# Patient Record
Sex: Female | Born: 1955 | Race: White | Hispanic: No | Marital: Married
Health system: Midwestern US, Community
[De-identification: ages and names within clinical notes are randomized; demographics above are authoritative.]

---

## 2017-09-13 ENCOUNTER — Ambulatory Visit (HOSPITAL_COMMUNITY): Payer: Self-pay | Admitting: Audiologist

## 2019-07-06 NOTE — Telephone Encounter (Signed)
patient calling  She had imaging done at [facility] and wants to know if MD needs those. Patient has appt on 07/17/19    Call patient at [phone]

## 2019-07-10 NOTE — Telephone Encounter (Signed)
Called patient to discuss imaging done at [facility] clinic asked patient to return my call.

## 2019-07-10 NOTE — Telephone Encounter (Signed)
Patient returned call. Please advise.

## 2019-07-11 NOTE — Telephone Encounter (Signed)
Phone call was transferred to me, pt stated she is moving to [place]. She will be moving in with her sister while she finds a place to live. Pt stated she will call back with her sister's address to update her chart. I verbalized that she will need to have a home address within state lines to complete the visit. Pt verbalized understanding and had no further questions.

## 2019-07-11 NOTE — Unmapped (Signed)
Called Chestine Spore no answer, left VM.     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 Pt is scheduled for a NPV video visit on 07/17/19, with patient not having a home address within the states of licensure pt appt will need to be cancelled. If pt would like to see MD she will need to come for an in person appt, the next in person appointment with Dr. Jimmey Ralph currently is 11/01/19 @9 :00. Please inform pt when she calls back and reschedule her appropriately, thanks!

## 2019-07-11 NOTE — Telephone Encounter (Signed)
Pt imaging reports are in the media tab. No further action needed.

## 2019-07-11 NOTE — Telephone Encounter (Signed)
Pt returned call to [place]. She is hoping to keep the VIDEO appt, they are moving here and should have their address before the appt.

## 2019-07-16 NOTE — Unmapped (Signed)
Called Martha Davila verbalized that pt has not yet completed her questionnaries for upcoming NPV video visit on 07/17/19. I verbalized that these are a requirement for the clinic and must be completed prior to the appointment. Pt verbalized understanding and had no further questions.

## 2019-07-17 ENCOUNTER — Ambulatory Visit: Admit: 2019-07-17 | Discharge: 2019-07-17 | Payer: PRIVATE HEALTH INSURANCE | Attending: Neurology

## 2019-07-17 DIAGNOSIS — G4452 New daily persistent headache (NDPH): Secondary | ICD-10-CM

## 2019-07-17 MED ORDER — diclofenac (VOLTAREN) 50 MG EC tablet
50 | ORAL_TABLET | ORAL | 3 refills | Status: AC
Start: 2019-07-17 — End: ?

## 2019-07-17 NOTE — Unmapped (Signed)
[facility]   [facility]  Initial Visit    Patient name: Martha Davila  MRN:  [id number]  Date: 07/17/2019    History of Present Illness     CC: Headache     This is a 64 y.o. year old female who presents to the headache clinic as a new patient for evaluation of headaches.    Age of onset: Headaches first started 4 years ago. Started ear pain on the left and then significant headache and neck pain and tinnitus. ENT had just given her antibiotics and steroid. She was also told some fluid behind that ear. She decided to go to ER - [facility]. She had CT brain unremarkable and then an LP with some difficulties. She states she had  MRI that was unremarkable was sent to ENT.  She was told she could do a blood patch but she wasn't interested at that time.      She said her arms were going numb and she had a severe headache that developed. She had severe diarrhea as well.  She saw her ENT and was told unremarkable exam and that it would go away on its own.  She said she was told left ear has negative pressure. She said she felt like she couldn't even hold her head up. She had difficulty eating.She states she has gone to multiple institutions for neurology and neurosurgical evaluation. She states [facility] saw her and repeated MRI brain and did EMG/NCS for her paresthesias and nothing was found but she was diagnosed with depression. She was placed on remeron, klonopin, and olanzapine. She has been weaned off of this because she felt only psychiatric diagnosis were being addressed but not cause of her headaches.  She has also been to [facility] and had an unrevealing workup.  She states she has scheduled appts at [facility] and a second opinion at [facility] for later this month.  Two weeks ago she saw [facility] Dr Karrie Doffing and was diagnosed with positional headache and started on Venlafaxine which she hasn't taken yet. Reviewed Dr Gladys Damme note in detail and as  well as outside imaging.    Aura:No  Location: Bilateral occipital and Bilateral temples  Quality:  Squeezing type pain  Duration: constant daily head pain  Intensity: Headache ranges from 4 to 9/10     Frequency: 30 Headache days/month     15 Severe, and the rest are mild - moderate.       Associated factors:    Yes Photophobia and phonophobia    No Nausea and/or vomiting   Yes Blurred vision or double vision  - since LP she has a constant flicker in right eye and floaters in both eyes    Yes Worsened with activity   Yes Dizziness/light headedness but no spinning   Yes Focal numbness/tingling - left face and arm numbness/tingling, started four years ago.   No Focal weakness   No Autonomic features   Yes Neck/Shoulder pain or stiffness - worse on left. She tried PT and medical massage for a year   No Restless/Pacing the floors improves it    Yes Worsens with cough   Yes Positional  - improvement with laying down   Yes Tinnitus    No Headache awakens from sleep    Yes Time of day pattern - worse in evening    Alleviating factors: darkness;lying down  Aggravating Factors: bending over;activity;coughing    Head Trauma:  No    Medications:  Current Preventives: None  Current Abortives:  Aleve or Advil (heps a small amount)  Number of days acute medications used: 0 days/week  Failed Headache Meds: Rizatriptan (didn't help)  *Allergic to sulfa      Review of Systems     ROS    Answers for HPI/ROS submitted by the patient on 07/16/2019  Activity Change: Yes  Appetite change: No  Chills: No  Diaphoresis (Excessive Sweating): No  Fatigue: Yes  Fever: No  Unexpected Weight Change: No  Facial Swelling: No  Neck Pain: Yes  Neck Stiffness: Yes  Ear Discharge: No  Hearing Loss: Yes  Ear Pain: Yes  Tinnitus (ringing in ears): Yes  Nosebleeds: No  Congestion: No  Rhinorrhea (Excessive discharge from the nose): Yes  Postnasal Drip: Yes  Sneezing: No  Sinus Pressure: Yes  Dental Problems: No  Drooling: No  Mouth Sores: No  Sore Throat:  No  Trouble Swallowing: No  Voice Change: Yes  Eye Discharge: No  Eye Itching: No  Eye Pain: Yes  Eye Redness: Yes  Photophobia (Sensitivity to Light): Yes  Visual Disturbance: Yes  Apnea: No  Chest Tightness: No  Choking: No  Cough: No  Shortness of Breath: Yes  Stridor (High pitched wheezing): No  Wheezing: No  Chest Pain: No  Leg Swelling: No  Palpitations (Abnormal heartbeat): Yes  Abominal distention (Abdominal Swelling): No  Abdominal Pain: No  Anal Bleeding: No  Blood in Stool: No  Constipation: No  Diarrhea: No  Nausea: No  Rectal Pain: No  Vomiting: No  Cold Intolerance: Yes  Heat Intolerance: No  Polydipsia (excessive thirst): Yes  Polyphagia (increased appetite): No  Polyuria (frequent urination): No  Difficulty urinating: No  Dysuria (Pain with Urination): No  Enuresis (Urinary incontinence): No  Flank pain (Pain on one side between upper abdomen and back): No  Frequency (urine): No  Genital sore: No  Hematuria (blood in urine): No  Urgency (urine): No  Urine decreased: No  Arthralgias (Joint pain): No  Back pain: Yes  Gait problem (Problems walking): No  Joint swelling: No  Myalgias (Muscle Pain): No  Color change: No  Pallor (Pale skin): No  Rash : No  Wound: No  Environmental allergies: Yes  Food Allergies : No  Immunocompromised: No  Dizziness: Yes  Facial asymmetry: No  Headaches : Yes  Light-headedness: Yes  Numbness: Yes  Seizures: No  Speech difficulty: No  Syncope (fainting): No  Tremors: No  Weakness: No  Adenopathy (swollen lymph): No  Bruises/bleeds easily: Yes  Agitation: No  Behavior problem: No  Confusion: No  Decreased concentration: Yes  Dysphoric mood (unpleasant mood): No  Hallucinations: No  Hyperactive: No  Nervous/anxious: Yes  Self-injury: No  Sleep disturbance: Yes  Suicidal ideas: No       Past History/Allergies     Past Medical History  Past Medical History:   Diagnosis Date   ??? Anxiety and depression        Past Surgical History  Past Surgical History:   Procedure Laterality  Date   ??? SINUS SURGERY     ??? TONSILLECTOMY         Past Family History  History reviewed. No pertinent family history.    Social History    No Drug use  No Alcohol use  Non smoker    Allergies  Allergies   Allergen Reactions   ??? Tetracycline Shortness Of Breath and Rash   ??? Morphine Nausea  Only   ??? Sulfa (Sulfonamide Antibiotics) Rash       Home Medications     Current Outpatient Medications   Medication Sig   ??? estrogen (conjugated)-medroxyprogesterone Take 1 tablet by mouth daily.   ??? eszopiclone Take 1 mg by mouth at bedtime. Take immediately before bedtime.     No current facility-administered medications for this visit.       Physical Examination     Vital Signs: There were no vitals taken for this visit.   **EXAMINATION LIMITED BY TELEVIDEO EXPERIENCE    General Appearance: Well nourished female in no acute distress  HEENT: Atraumatic, normal sclera, moist mucous membranes.  +GON tenderness bilaterally  Pulmonary: no increase WOB  Psych: Normal mood and affect  Musculoskeletal: normal ROM, tenderness in neck and shoulders    Neurology:  Mental Status:   Alert oriented to person, place, time and situation.  Able to follow simple and complex commands.  Language appropriate.    Cranial Nerves  II: Visual fields grossly intact to finger confrontation.    III/IV/VI: Extraocular movements intact without nystagmus      V: Facial sensation normal to light touch bilaterally  VII: No gross facial asymmetry   VIII: Hearing intact to voice  IX/X: Palate elevation symmetric  XI: sternocleiomastoid grossly normal with head turns and shoulder shrug  XII: Tongue protrudes midline    Motor:  All limbs antigravity - no gross focal abnormalities observed.  No pronator drift. Able to walk on heels and toes.    Sensory: Intact to light touch in all extremities     Coord: Finger to nose intact bilaterally.  Heel-Knee-Shin intact    Gait: Normal gait. Able to walk tandem without difficulty. Romberg negative    Diagnostic Imaging      Multiple MRI's reviewed and uploaded under Media tab, most recent 2 below:    MRI Brain w/wo contrast 05/09/2019  Impression: Normal MRI brain    MRI brain w/wo contrast 08/2015  Stable tiny left cerebellar lacunar infarct. No significant change since 04/2015.     Assessment     Encounter Diagnoses   Name Primary?   ??? New daily persistent headache Yes   ??? Intractable chronic migraine without aura and without status migrainosus    ??? Positional headache    ??? Bilateral occipital neuralgia    ??? Chronic neck pain    ??? Tinnitus of both ears        Patient with a four year history of new onset headaches that started suddenly with neck stiffness/pain and dizziness and significantly worsened in severity following lumbar puncture who currently meets International Classification of Headache Disorders-3 criteria for new daily persistent headache with chronic migraine features and possible occipital neuralgia.  She does have a mild positional headache where symptoms improve some with laying down but discussed this can be seen with migraine headache as well. Potential CSF leak 2/2 LP was investigated with MRI brain which does not show any of the features of intracranial hypotension.    Reviewed previous imaging from several institutions, as well as previous neurology notes from [place].  Examination non-focal and last MRI completed 3 months ago so no need for further imaging at this time.  If headaches change or new symptoms arise will re-evaluate the need at next visit.      Despite having headache present daily for the last four years she has not done a trial of a headache preventive medication.  Recommend patient try a  migraine headache preventive prior to non-targeted blood patch since no strong objective evidence pointing towards CSF leak currently. Patient recently evaluated by [facility]surgery two weeks ago. Agree with their plan to start Effexor for headache prevention. If fails Effexor could consider TCA vs.  BB/CCB vs. Botox for chronic migraine.  Patient states she has already contacted [facility] and requested a repeat opinion at [facility] to discuss intracranial hypotension treatment options further later this month.  Will get the rest of her records for review from [facility] and [facility].    Discussed potential of GONB to break current daily headache cycle since she has Bilateral ON tenderness and chronic daily headache.  She could also benefit from trying a round of steroids vs. IV outpatient infusions.    Hx of lacunar infarct - MRI 2017. Recommend patient start asprin 81mg  daily. Secondary stroke risk factor counseling was completed. Recommend she have her HgA1c and lipid panel checked with PCP and start statin if LDL above goal below.    Chronic neck pain - consider MRI C Spine if not completed yet. Recommend PT or medical massage therapy for myofascial pain.    Plan     1. Preventive medication:   --Agree with starting Effexor 37.5mg  daily x 82month then 75mg  daily  --Start OTC supplements: Magnesium 400mg , Riboflavin (vitamin B2) 400mg , and coenzyme Q10 100/200mg  daily     2. Abortive medication:  --Triptan: No DHE/Triptan due to stroke history  --Start Diclofenac 50mg  every 6 hours as needed for medium to severe headache. Do NOT take more than 3 pills in 24hrs. Do not use with other NSAIDs and do not take more then 3 days/week to prevent medication overuse headache  --Discussed medication overuse headache in detail and to limit use of analgesics to no more than 2-3 days per week to prevent this.    3.  Reviewed previous imaging in detail with patient.    --Tests or labs ordered: None    4. Headache education was done:  --Discussed headache type listed above in detail with patient and typical pattern of how specific headache type behaves over course of life.  --Instructed patient about preventive/abortive medication options and discussed potential adverse effects and drug interactions of prescribed medications  above.  --Discussed importance of keeping a headache diary to track frequency, severity, abortive med use and triggers.   --Discussed triggers and lifestyle modifications including limiting caffeine consumption, limiting alcohol intake, increasing water intake, exercise 3-4x/week for at least 30-40 minutes and the importance of sleep hygiene.    --Discussed the relationship of mood and headache and the importance of treatment/therapy for mood disorders.    5. Stroke Risk factor management:   --Start aspirin 81mg  daily  --Goal BP < 120/80  --Goal LDL <70, Goal HDL>50   --Goal HgbA1c< 7  --Goal BMI < 30 kg/m2   --Discussed the importance of diet and exercise (at least 30 minutes of exercise 3-4 days/week)       6. Discussed that if patient has any new or worsening neurologic changes to seek urgent medical evaluation at the nearest ER.  Patient verbalized understanding of the above plan and encouraged to call the office if further questions or concerns.      Follow up: 3-4 months    CC:   --PCP: None    Thank you for referral and allowing me to participate in care of this patient. Please do not hesitate to contact me with questions/concerns.    This was a Video  visit, including two-way audio and video communication, in lieu of an in-person visit. The patient provided verbal consent to participate in the telehealth visit.   I spent 62 minutes speaking with the patient, conducting an interview, performing a limited telemedicine exam, and educating the patient on my assessment and plan. I also spent 40 minutes, on the same day as the encounter, preparing to see the patient (eg, review of tests), obtaining and/or reviewing separately obtained history, ordering medications, tests, or procedures, documenting clinical information in the electronic or other health record, providing care coordination  and performing non-face-to-face activities.      Antonietta Breach, MD  Assistant Professor of Neurology  [facility]  [facility]

## 2019-07-17 NOTE — Patient Instructions (Addendum)
Start Effexor for daily headache prevention    Recommend these daily headache supplements:  --Vitamin B2 400 mg daily.  Can cause your urine to turn orange.  --Magnesium 400-600 mg daily. Can cause diarrhea in some people, if this occurs okay to reduce dose or stop altogether.  --Coenzyme Q10 200mg  in the AM and 100mg  in the PM for a total of 300mg /day    Start Diclofenac 50mg  every 6 hours as needed for medium to severe headache. Do NOT take more than 3 pills in 24hrs. Do not use with other NSAIDs and do not take more then 3 days/week to prevent medication overuse headache    Start asprin 81mg  daily due to history of stroke    Please keep a headache diary notating headache days, severity of headache, meds used and possible triggers  --You can download the Migraine Buddy App on your phone to help you track or you can use a regular calendar, please ask staff for a migraine calendar if needed.    Counseling on migraine headache was provided:  - Appropriate use of abortive treatment: Do not use abortive (triptan or NSAID) more than two days per week to reduce the risk of rebound/medication overuse headache.  - Maintain a regular sleep pattern, at last 8 hours of uninterrupted sleep every night  - Avoid caffeine if possible. If you are drinking an increased amount of caffeine recommend you slowly wean down to 1 caffeinated beverage/day  - Avoid excessive alcohol  - Maintain good hydration, drink at least 8 cups of 8 Oz water / non caffeine drinks a day  - Exercise at least 3 days a week, for at least 30 minutes  - Women of childbearing years: Recommend the use of some form of contraception while being on the prescribed headache medications because of the potential of toxicity to the fetus. If you become pregnant please call the office immediately to discontinue the medication.    Important:   Medication Refills: Please call in medication refill requests in a timely manner, at least 3-4 days PRIOR to you running out of  your medication, to insure you receive your refills in time as they can take 24-48 hours to process.    MyChart Appropriate Usage:  MyChart messaging is for brief non-urgent questions or comments. These messages will be answered within 2-3 business days. If you have an urgent matter, have several questions or concerns please call our office directly to speak with staff so your issue can be properly triaged and handled in a timely manner.

## 2019-08-21 NOTE — Telephone Encounter (Signed)
ERROR

## 2019-10-09 NOTE — Telephone Encounter (Signed)
Pt is trying to send in paperwork over Mychart for Dr Jimmey Ralph to look at. Requesting call back. Please Advise.

## 2019-10-09 NOTE — Telephone Encounter (Signed)
Called and spoke to patient who stated that she send over some attachments for Dr. Jimmey Ralph to look at and she was worried that maybe she has an infection. I informed her that I received her mychart message and have forwarded over to Dr. Jimmey Ralph for review. Informed patient it would probably be tomorrow before we can get back to her.

## 2019-10-17 NOTE — Telephone Encounter (Signed)
Patient called to see if her appointment could be video instead of in person. Requesting a call back. Please advise.

## 2019-10-17 NOTE — Telephone Encounter (Signed)
Called and spoke to patient and she stated that she had another video visit to get on and to just reschedule her because she is not feeling good this week due to a UTI. Patient stated she would check My Chart for the new date. Scheduled her for in person (per the note showing) for Sept. 30th with the NP

## 2019-10-18 ENCOUNTER — Ambulatory Visit: Payer: PRIVATE HEALTH INSURANCE | Attending: Neurology

## 2019-11-08 ENCOUNTER — Ambulatory Visit: Payer: PRIVATE HEALTH INSURANCE | Attending: Family

## 2019-12-18 NOTE — Unmapped (Signed)
I cannot care for patient or have an video visit if she lives in [place] and she should not travel if she is concerned about meningitis - she should go to the ER for urgent evaluation and possible treatment. If she was unhappy with her previous ER a few weeks ago she should go to another ER close to her, perhaps one attached to university[facility]adem[facility] if possible.

## 2019-12-18 NOTE — Unmapped (Signed)
Called and lvm for the patient to call back so we can give Dr.Parkers information below.

## 2019-12-18 NOTE — Unmapped (Signed)
Pt is calling to inform that she is concerned that she may have meningitis. Pt c/o her neck being really stiff, getting crackling in her head and spine is hurting as well. Pt states this started a few weeks ago, went to the ER and a CTA of her head was done. Pt states she was told she is fine. Pt states she is always cold and temp has been 95.5. pt also states she kept getting infections since Spring and had blood patches. Pt mentioned she would come to [place] if she needs an LP. Pt lives in [place]. Pls call the pt.

## 2019-12-18 NOTE — Unmapped (Signed)
Pt called back and was given the message from Dr Jimmey Ralph and says thank you.

## 2020-04-02 NOTE — Telephone Encounter (Signed)
MA received past documentation on patient. Doctor to review it.    Electronically signed by Drue Stager on 04/02/2020 at 2:42 PM

## 2020-04-21 NOTE — Progress Notes (Signed)
MA called patient to reschedule appointment on 04/25/20. Left message.   Electronically signed by Drue Stager on 04/21/2020 at 1:19 PM

## 2020-04-25 ENCOUNTER — Encounter: Attending: Surgery

## 2020-05-23 ENCOUNTER — Encounter: Attending: Surgery

## 2020-09-12 ENCOUNTER — Telehealth (INDEPENDENT_AMBULATORY_CARE_PROVIDER_SITE_OTHER): Payer: Self-pay | Admitting: Orthopaedic Surgery

## 2020-09-12 NOTE — Nursing Note (Signed)
[facility] Referral  Received: Today  [facility]; Jolayne Panther Laurin Coder, LPN  I scanned records that was sent to me today and sent an e-mail to [facility]     Thanks,   [place] - records received. Messaged Dr Mendel Ryder info. Jen 8/5 @ 1557    2-3 weeks per Dr Mendel Ryder. Message sent for scheduling. Delsa Sale 8/5 @ [place], RN  09/12/2020, 16:01

## 2020-10-01 NOTE — Telephone Encounter (Signed)
Name of Caller: Alexina Kerestes phone number: [phone]    Relationship to Patient: patient    Provider: Para March    Practice:  Ortho    Chief Complaint/Reason for Call: Patient called stating that her PCP should be sending a referral for her to see Dr Para March. PCP Dr Alain [facility]. Patient was given Ortho fax number and advised a msg would be sent.     Best time of day caller can be reached:        Patient advised that office/PCP has 24-48 business hours to return their call: yes

## 2020-10-06 ENCOUNTER — Encounter (INDEPENDENT_AMBULATORY_CARE_PROVIDER_SITE_OTHER): Payer: Self-pay

## 2020-10-06 ENCOUNTER — Ambulatory Visit (HOSPITAL_BASED_OUTPATIENT_CLINIC_OR_DEPARTMENT_OTHER): Payer: BC Managed Care – PPO

## 2020-10-06 ENCOUNTER — Other Ambulatory Visit (HOSPITAL_BASED_OUTPATIENT_CLINIC_OR_DEPARTMENT_OTHER): Payer: BC Managed Care – PPO | Admitting: Radiology

## 2020-10-06 ENCOUNTER — Ambulatory Visit: Payer: BC Managed Care – PPO | Attending: Orthopaedic Surgery | Admitting: Orthopaedic Surgery

## 2020-10-06 ENCOUNTER — Other Ambulatory Visit: Payer: Self-pay

## 2020-10-06 ENCOUNTER — Encounter (HOSPITAL_BASED_OUTPATIENT_CLINIC_OR_DEPARTMENT_OTHER): Payer: Self-pay | Admitting: Orthopaedic Surgery

## 2020-10-06 ENCOUNTER — Inpatient Hospital Stay (HOSPITAL_BASED_OUTPATIENT_CLINIC_OR_DEPARTMENT_OTHER)
Admission: RE | Admit: 2020-10-06 | Discharge: 2020-10-06 | Disposition: A | Discharge status: Home / Self Care | Payer: BC Managed Care – PPO | Source: Ambulatory Visit | Admitting: Radiology

## 2020-10-06 VITALS — BP 121/75 | HR 71 | Temp 97.5°F | Ht 63.58 in | Wt 109.8 lb

## 2020-10-06 DIAGNOSIS — M899 Disorder of bone, unspecified: Secondary | ICD-10-CM | POA: Insufficient documentation

## 2020-10-06 DIAGNOSIS — D169 Benign neoplasm of bone and articular cartilage, unspecified: Secondary | ICD-10-CM | POA: Insufficient documentation

## 2020-10-06 DIAGNOSIS — D1631 Benign neoplasm of short bones of right lower limb: Secondary | ICD-10-CM

## 2020-10-06 DIAGNOSIS — M533 Sacrococcygeal disorders, not elsewhere classified: Secondary | ICD-10-CM

## 2020-10-06 DIAGNOSIS — D1601 Benign neoplasm of scapula and long bones of right upper limb: Secondary | ICD-10-CM

## 2020-10-06 DIAGNOSIS — D1621 Benign neoplasm of long bones of right lower limb: Secondary | ICD-10-CM | POA: Insufficient documentation

## 2020-10-06 NOTE — H&P (Signed)
PATIENT NAME: Erika Zavala NUMBER:  [id number]  DATE OF SERVICE: 10/06/2020  DATE OF BIRTH:  [birth year]    HISTORY AND PHYSICAL    CHIEF COMPLAINT:  Sacrum pain.    HISTORY OF PRESENT ILLNESS:  [name] is a 65 year old female who reports that she has had several months of pain in her sacrum.  She notes that she has several tumors throughout her body and has seen providers in [place], at the [facility], and elsewhere for many of these problems.  Her main complaint is pain in her sacrum and she is worried that she has a tumor there.  She has not had any surgeries or been diagnosed with any malignancies.  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  Her pain in her sacrum is mainly when sitting and standing.  She denies any numbness or tingling, CP, SOB or any other complaints.    PAST MEDICAL HISTORY:  Right proximal humerus enchondroma, right proximal tibia osteochondroma.    MEDICATIONS:  Lunesta.    ALLERGIES:  SULFA DRUGS and PERCOCET.    PAST SURGICAL HISTORY:  The patient reports surgeries on her right knee, foot, and wrist.  She has also had a tonsillectomy.    PAST FAMILY HISTORY:  The patient does not report any significant family history.    SOCIAL HISTORY:  The patient is a retired Occupational hygienist.  She does not smoke, drink alcohol, or use illicit drugs.    REVIEW OF SYSTEMS:  Negative, except as mentioned above.    PHYSICAL EXAMINATION:  Vital signs show a blood pressure of 121/75, pulse 71, temperature 36.4 degrees Celsius, weight of 49.8 kg, height 161.5 cm.  The patient is alert, in no acute distress.  Appears stated age.  Her heart rate is regular.  Breathing is nonlabored.  Examination of the bilateral upper extremities show no pain with range of motion of the shoulder.  She is able to give a thumbs-up, A-OK sign, cross index and middle finger as well as  abduct her fingers without issue and reports normal sensation to light touch in median, ulnar, and radial nerve distributions of the hand.  She is mildly tender to palpation over her proximal humerus anteriorly.  Examination of the bilateral lower extremities show mild pain with range of motion of the hips.  She is tender to palpation over her sacrum, particularly on the left-hand side.  She is able to dorsiflex and plantarflex her ankles without issue.  She has normal sensation to light touch throughout both feet.  There is a small mass over her right proximal fibula that she reports is tender to palpation.    IMAGING:  The patient brought numerous outside images, including a PET scan, MRIs of the pelvis, humerus and tib-fib from recent months and additionally, we obtained x-rays of the bilateral hips, right humerus, and right tib-fib today.  All these were reviewed with Dr. Mendel Ryder and independently interpreted by him.  Per his interpretation, there really is not any significant signal on the PET scan to suggest any malignancy.  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  Additionally, there is an osteochondroma of the right proximal fibula, also stable on imaging.  There is some cystic change in her S3-S4 area without any fractures being appreciated.  ASSESSMENT/PLAN:  Erika Zavala is a 65 year old female with known enchondroma of the right proximal humerus as well as osteochondroma of the right proximal fibula.  We discussed her condition with her at length and reviewed much of the imaging with her.  The enchondroma and osteochondroma appear stable on imaging and are benign.  The cystic changes in her sacrum are likely due to arthritis and we do not necessarily feel the need to work them up further at this time.  She can continue activity as tolerated and we can see her back on an as-needed basis.  If she has any questions or concerns going forward,  she can give Erika Zavala a call and we would be happy to help her.        Pauline Aus, MD      Enis Slipper, MD  Associate Professor   [facility] Department of Orthopaedics        I saw and examined the patient.  I reviewed the resident's note.  I agree with the findings and plan of care as documented in the resident's note.  Any exceptions/additions are edited/noted.    Enis Slipper, MD          DD:  10/06/2020 16:56:11  DT:  10/06/2020 17:34:00 LB  D#:  [id number]

## 2020-10-07 DIAGNOSIS — M160 Bilateral primary osteoarthritis of hip: Secondary | ICD-10-CM

## 2020-10-07 DIAGNOSIS — M898X6 Other specified disorders of bone, lower leg: Secondary | ICD-10-CM

## 2020-10-07 DIAGNOSIS — M5137 Other intervertebral disc degeneration, lumbosacral region: Secondary | ICD-10-CM

## 2020-10-08 DIAGNOSIS — M898X2 Other specified disorders of bone, upper arm: Secondary | ICD-10-CM

## 2020-10-17 NOTE — Telephone Encounter (Signed)
LM 10/17/20 on both phone #'s. '@2'$ :05pm. 2nd attempt to call. Also called [facility] with Almyra Free to let the Referral Dr. Kinnie Scales office know that we have tried to called multiple times. Trying to get her in Sept 13 per Vaughan Basta. Maudie Mercury

## 2020-10-20 NOTE — Telephone Encounter (Signed)
Patient called in regarding the appt suggested for 9/13 per Linda's msg to patient. Patient was confirmed for 9/13 @ 1:30 per Elzie Rings

## 2020-10-21 ENCOUNTER — Ambulatory Visit: Admit: 2020-10-21 | Discharge: 2020-10-21 | Attending: Orthopaedic Surgery

## 2020-10-21 DIAGNOSIS — D169 Benign neoplasm of bone and articular cartilage, unspecified: Secondary | ICD-10-CM

## 2020-10-21 NOTE — Progress Notes (Signed)
[facility] [place]   [address]  [place] [address]  Dept: [place]: [phone]        10/21/2020    Chief Complaint   Patient presents with    New Patient     Right fibula Osteochondroma       SUBJECTIVE     HPI      Jenine is a 65 year old woman that presents to me for evaluation of her left-sided sacral pain, as well as osteochondroma of her right fibula.  Retia has had a somewhat complicated work-up over the last 1 to 2 years, with consultation with multiple specialists around the country.  She has been to some of the most famous centers in the [place].  She states that her pain in her left sacral region started about 1 and half years ago after she had a fall.  She began to have excruciating pain that is severely limited her physical activity since that time.  Patient and family report that prior to her fall and her pain, she was able to golf, cycle, swim, and remained very active.  Since her fall, she has had daily pain that continues to limit her.  She has been evaluated by multiple specialists and has had a plethora of imaging studies completed.  She currently reports her greatest pain is a sharp pain over her left buttocks that makes sitting very uncomfortable.  She does not think her pain changes with standing up straight versus leaning forward.  She also reports subjective numbness that travels down the inside of her left leg to the medial aspect of her foot.  She has not noticed any weakness of her lower extremities.  She was found to have an incidental osteochondroma of the fibula which I think is totally unrelated.  We spent an extensive amount of time discussing her travels and her story about how she ended up seeing multiple doctors.  She was worked up for a marrow edema of the sacrum and has seen to oncologist and they are doing a bone marrow biopsy but not a biopsy of the lesion according to her.   She has no history of cancer and her to hematology oncology consult did not feel she had malignant disease.  She has seen the neurosurgeons who have recommended SI fusion coccygectomy and potentially other procedures.  The last opinion she got was at [facility].  She was referred to me after seeing Dr. Mariel Kansky and Mendel Ryder were both excellent orthopedic oncologist.  Actually had a long discussion that she probably should give the doctor shopping and stick with one team.      There are no problems to display for this patient.      Allergies   Allergen Reactions    Tetracycline Anaphylaxis, Other (See Comments), Rash and Shortness Of Breath    Fenoprofen      Other reaction(s): Colitis, NOS    Oxycodone     Morphine Nausea Only    Sulfadiazine Rash       No family history on file.    No past medical history on file.    Social History     Socioeconomic History    Marital status: Married     Spouse name: Not on file    Number of children: Not on file    Years of education: Not on file    Highest education level: Not  on file   Occupational History    Not on file   Tobacco Use    Smoking status: Never    Smokeless tobacco: Never   Substance and Sexual Activity    Alcohol use: Not on file    Drug use: Not on file    Sexual activity: Not on file   Other Topics Concern    Not on file   Social History Narrative    Not on file     Social Determinants of Health     Financial Resource Strain: Not on file   Food Insecurity: Not on file   Transportation Needs: Not on file   Physical Activity: Not on file   Stress: Not on file   Social Connections: Not on file   Intimate Partner Violence: Not on file   Housing Stability: Not on file       Past Surgical History:   Procedure Laterality Date    FOOT SURGERY      nerve entrapment    KNEE SURGERY Right     MCL, Chondroplasty    SINUS SURGERY      TONSILLECTOMY      WRIST GANGLION EXCISION         Current Outpatient Medications   Medication Sig Dispense Refill    albuterol sulfate HFA  (PROVENTIL;VENTOLIN;PROAIR) 108 (90 Base) MCG/ACT inhaler INHALE 2 PUFFS EVERY 4 HOURS AS NEEDED FOR COUGH, WHEEZE OR SOB      hydrOXYzine HCl (ATARAX) 10 MG tablet TAKE 2.5 TABLETS (25MG TOTAL) NIGHTLY      mirtazapine (REMERON) 15 MG tablet TAKE 1/2 TAB BY MOUTH AT BEDTIME       No current facility-administered medications for this visit.       Review of Systems   Constitutional:  Negative for activity change, appetite change, chills, diaphoresis, fatigue, fever and unexpected weight change.   HENT:  Negative for mouth sores, nosebleeds and trouble swallowing.    Eyes:  Negative for visual disturbance.   Respiratory:  Negative for apnea, cough, choking, chest tightness, shortness of breath, wheezing and stridor.    Cardiovascular:  Negative for chest pain, palpitations and leg swelling.   Gastrointestinal:  Negative for abdominal pain, constipation, diarrhea, nausea and vomiting.   Genitourinary:  Negative for difficulty urinating, flank pain and hematuria.   Musculoskeletal:  Positive for arthralgias. Negative for back pain, gait problem, joint swelling, myalgias, neck pain and neck stiffness.        Right proximal fibular pain, ongoing 5 years worsening pain over the last 4 months.   Skin:  Negative for color change, pallor, rash and wound.   Allergic/Immunologic: Negative for environmental allergies, food allergies and immunocompromised state.   Neurological:  Negative for dizziness, weakness, light-headedness, numbness and headaches.   Hematological:  Negative for adenopathy. Does not bruise/bleed easily.   Psychiatric/Behavioral:  Negative for behavioral problems, confusion, self-injury and sleep disturbance. The patient is not nervous/anxious.      OBJECTIVE       Vitals:    10/21/20 1352   BP: 122/76   Weight: 108 lb (49 kg)   Height: _0  (1.6 m)       Physical Exam  Vitals and nursing note reviewed.   Constitutional:       Appearance: Normal appearance. She is well-developed.   Pulmonary:      Effort:  Pulmonary effort is normal.      Breath sounds: Normal breath sounds.   Musculoskeletal:  General: No tenderness.   Skin:     General: Skin is warm and dry.   Neurological:      Mental Status: She is alert and oriented to person, place, and time.   Psychiatric:         Thought Content: Thought content normal.         Judgment: Judgment normal.       Left lower extremity:  There is extreme, point tenderness over the left SI joint  Mild tenderness to palpation over the greater trochanter  Pain with hip flexion  5 out of 5 strength in hip flexion/knee extension/dorsiflexion/EHL/plantarflexion   Decreased sensation to light touch in L4 distribution  Sensation otherwise intact to light touch over the extremity  Extremity is warm and well-perfused    Right lower extremity:  There is a small, hard, nonmobile mass over the lateral aspect of the lower leg that is mildly tender to palpation  Sensation intact to light touch over the extremity  + Motor knee extension/dorsiflexion/EHL/plantarflexion  Extremities warm and well-perfused      XRAY INTERPRETATION      I have reviewed the actual outside facility imaging studies obtained and interpreted them at the time of the visit.  I have reviewed the couple MRIs CT scan plain films and multiple other studies that she brought a disc for.    MRI pelvis: No acute fractures or dislocations.  There is perhaps some vascular abnormality/edema in the marrow of her left-sided sacrum.  She has prominent vascularity in the bone but no definitive lesion that I could see that I would need to consider doing a biopsy of.  Per my review I cannot differentiate any neoplastic process.  She does have some sclerosis of the left SI joint.        ASSESSMENT     1. Osteochondroma    2. Sacral back pain            Ill-defined pain syndrome involving some low back pain SI pain and significant disability who has been evaluated multiple doctors.      PLAN     1. Osteochondroma  - XR TIBIA FIBULA  RIGHT (2 VIEWS)  - MRI Pelvis W WO Contrast; Future    2. 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  We discussed that I plan to obtain MRI of her pelvis since her last scan was in March to ensure there are no changes that would be concerning for possible malignancy in that area.  Discussed with patient that she can obtain the MRI at a location at her convenience, but that it should be at a repeatable hospital and that the magnet should at least be 2 Tesla in strength since her previous MRIs are very poor quality..  Discussed that if her SI joint continues to limit her quality of life, would recommend follow-up with the surgeon who evaluated her at the [facility] for further treatment.  I will follow-up results from MRI of her pelvis, and call her with my impression and further recommendations.  At this time, I do not recommend biopsy of her sacrum without further imaging.  She is able to follow-up with me on an as-needed basis,  she can know she can call my office with any other questions or concerns.      Return if symptoms worsen or fail to improve, for Call our office at [phone] with any questions.          Voice recognition was used for portions of this note and although it was reviewed prior to signing some incorrect words or phrases could be present.    Electronically signedby Marca Ancona, MD on 10/21/20 at 8:14 AM EDT

## 2020-11-25 NOTE — Telephone Encounter (Signed)
Name of Caller: [facility] phone number: Rep  requested that patient is called back -  [phone]    Relationship to Patient: Insurance     Provider: Dr. Para March    Practice:  Ortho    Chief Complaint/Reason for Call: Rep states that they have not received the PA for patient Pelvis MRI order. Rep states patient is scheduled om 10.20.22 and the need prior to it happening. Rep asked to have patient called back regarding any update. Please advise.    Best time of day caller can be reached: any       Patient advised that office/PCP has 24-48 business hours to return their call: No

## 2020-11-27 ENCOUNTER — Inpatient Hospital Stay: Admit: 2020-11-27 | Attending: Orthopaedic Surgery

## 2020-11-27 MED ORDER — GADOBUTROL 1 MMOL/ML IV SOLN
1 MMOL/ML | Freq: Once | INTRAVENOUS | Status: AC | PRN
Start: 2020-11-27 — End: 2020-11-27
  Administered 2020-11-27: 18:00:00 5 mL via INTRAVENOUS

## 2020-11-27 NOTE — Other (Unsigned)
Patient Acct Nbr: [id number]   Primary AUTH/CERT:   [place] Name: [place]  Primary Insurance Plan name: Egbert Garibaldi  Primary Insurance Group Number: [id number]  Primary Insurance Plan Type: Health  Primary Insurance Policy Number: [id number]

## 2020-12-03 NOTE — Telephone Encounter (Signed)
Called [facility] today discussed MRI results and Dr Para March review  he does not feel it the abnormality is a tumor. He also does not feel this is the cause of her pain. He does not suggest a biopsy at this time and suggests she follow with the neurosurgeon she has been seeing for follow up back issues   I emailed a copy of the report and DR WEiner's review which she did receive   [email or web address]

## 2020-12-05 NOTE — Telephone Encounter (Signed)
Name of Caller: Corena Tilson phone number: [phone]    Relationship to Patient: patient    Provider: NEW PATIENT    Practice:  [facility]    Chief Complaint/Reason for Call: The patient would like to schedule a new appointment with Dr. Ferd Glassing.  Please call the patient to schedule and advise whether or not a referral is needed. Thank you    Best time of day caller can be reached: AM (Please call after 10 AM)      Patient advised that office/PCP has 24-48 business hours to return their call: No

## 2021-11-17 IMAGING — MR MRI LUMBAR SPINE WITHOUT CONTRAST
5 series · 48 of 48 positions shown · non-contrast
Comparison: none

﻿

Pertinent Hx:    Mid to lower back pain.
TECHNIQUE: Sagittal images with T1, T2, and STIR weighting are performed through the lumbar spine. Axial images with T1 weighting are performed consecutively from L2 to S1. Additional axials with T2 weighting are performed from L1-L2 through L5-S1.

[Series 2: T2 · sagittal · 3.5mm · 0.81mm/px · 8 of 15 slices shown (1 of 2)]
[im 1/15]
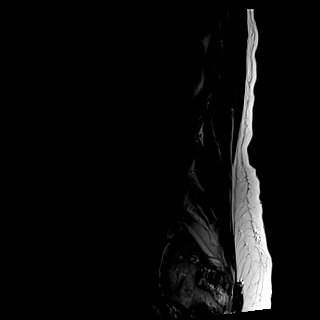
[im 3/15]
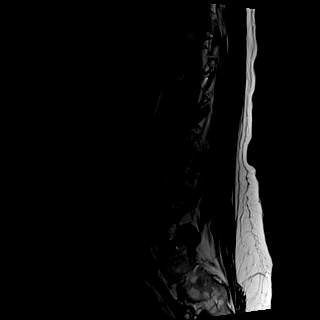
[im 5/15]
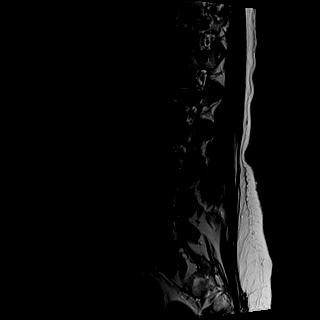
[im 7/15]
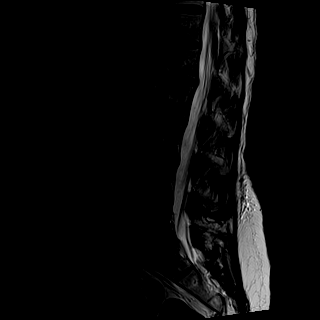
[im 9/15]
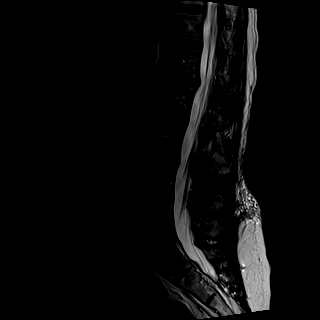
[im 11/15]
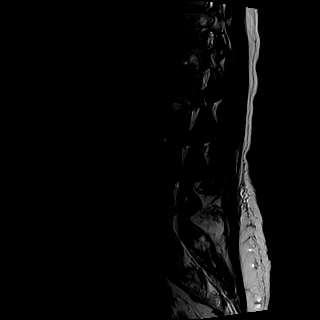
[im 13/15]
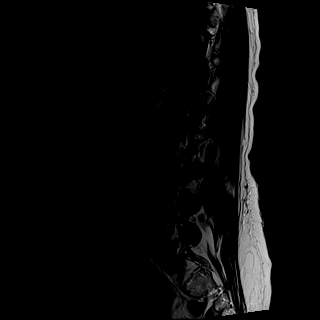
[im 15/15]
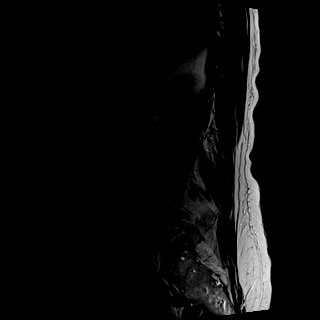

[Series 3: T1 · sagittal · 3.5mm · 0.81mm/px · 7 of 15 slices shown (1 of 2)]
[im 1/15]
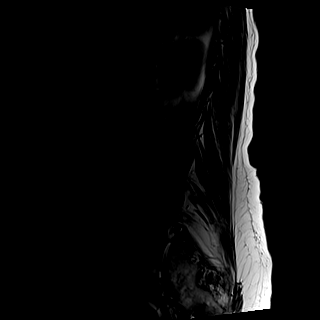
[im 3/15]
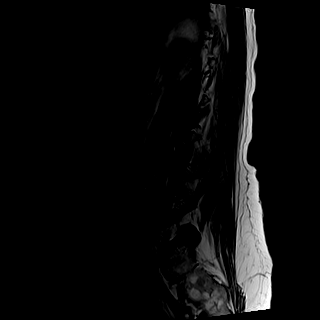
[im 5/15]
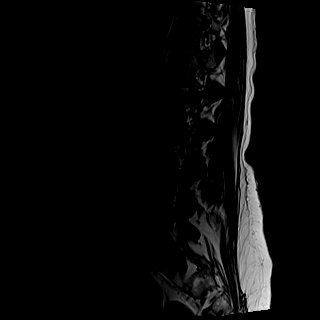
[im 8/15]
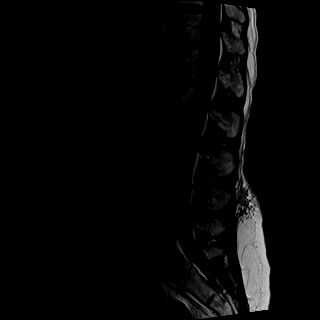
[im 10/15]
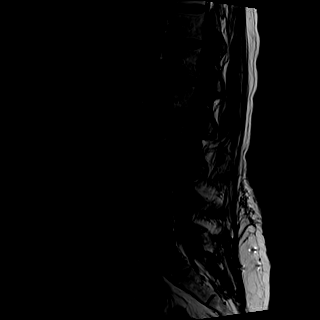
[im 12/15]
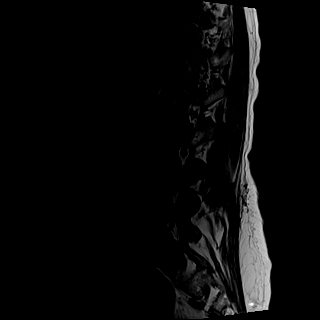
[im 15/15]
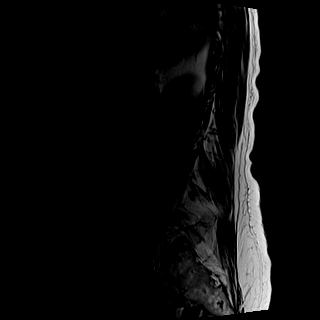

[Series 4: T2 · axial · 4.0mm · 0.70mm/px · z∈[-48,+144]mm · 13 of 26 slices shown (2 of 2)]
[im 1/26]
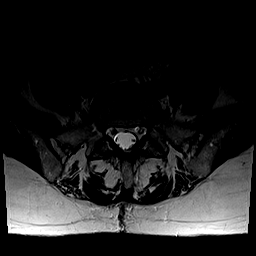
[im 3/26]
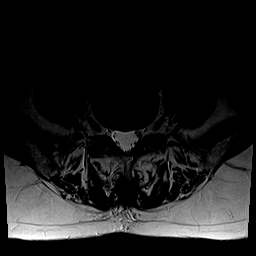
[im 5/26]
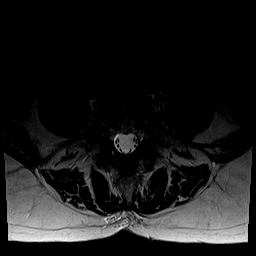
[im 7/26]
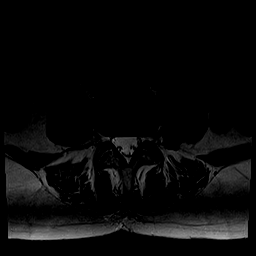
[im 9/26]
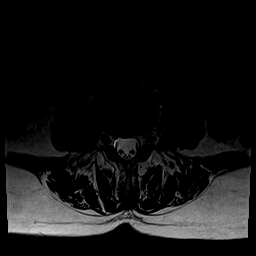
[im 11/26]
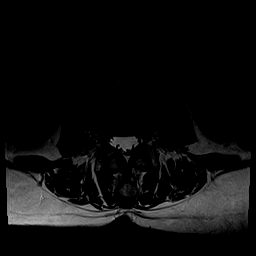
[im 13/26]
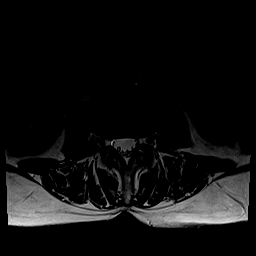
[im 15/26]
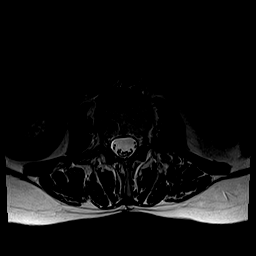
[im 17/26]
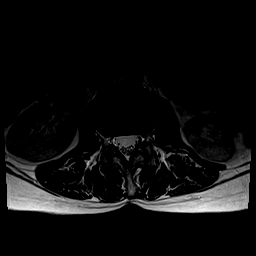
[im 19/26]
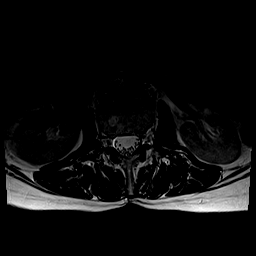
[im 21/26]
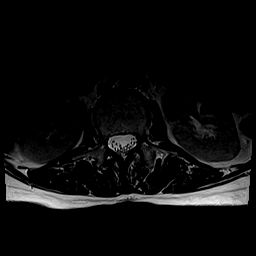
[im 23/26]
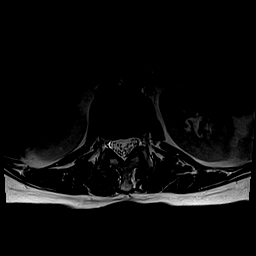
[im 26/26]
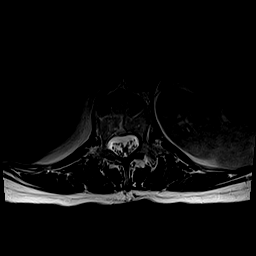

[Series 5: T1 · axial · 4.0mm · 0.70mm/px · z∈[-48,+144]mm · 13 of 26 slices shown (2 of 2)]
[im 1/26]
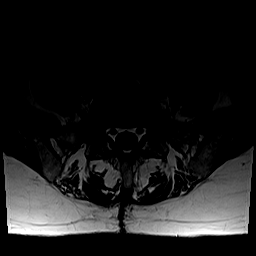
[im 3/26]
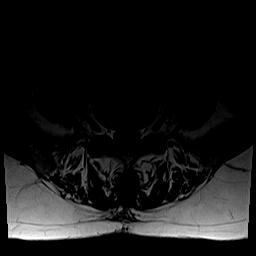
[im 5/26]
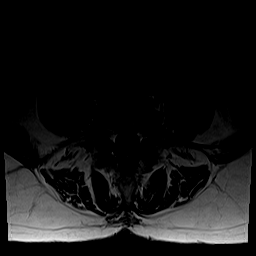
[im 7/26]
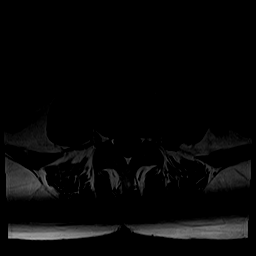
[im 9/26]
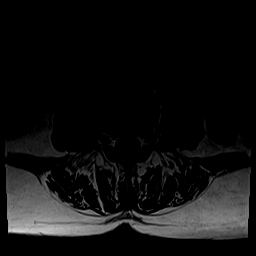
[im 11/26]
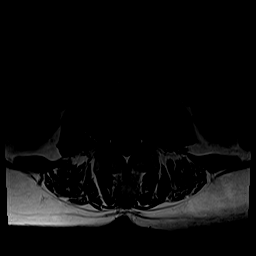
[im 13/26]
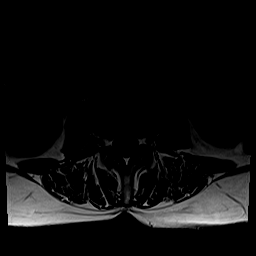
[im 15/26]
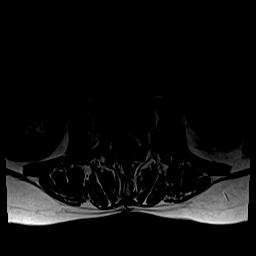
[im 17/26]
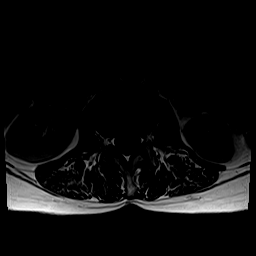
[im 19/26]
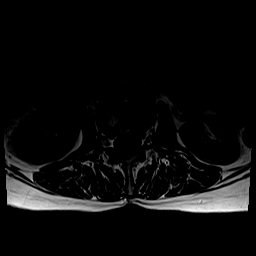
[im 21/26]
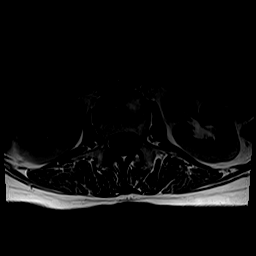
[im 23/26]
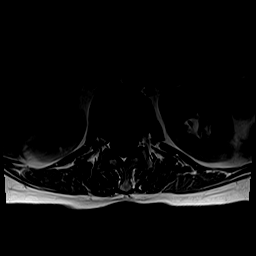
[im 26/26]
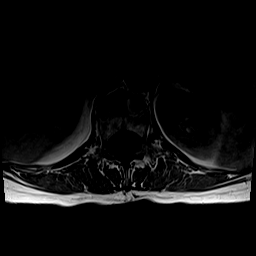

[Series 6: STIR · sagittal · 3.5mm · 1.02mm/px · 7 of 15 slices shown]
[im 1/15]
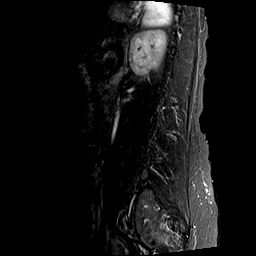
[im 3/15]
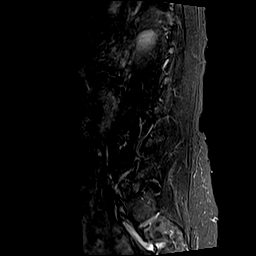
[im 5/15]
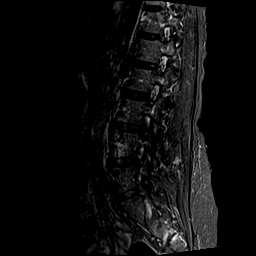
[im 8/15]
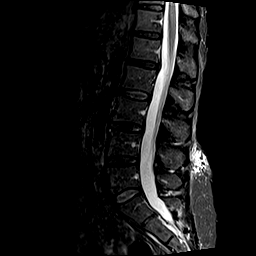
[im 10/15]
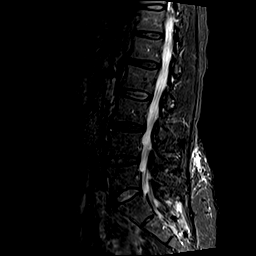
[im 12/15]
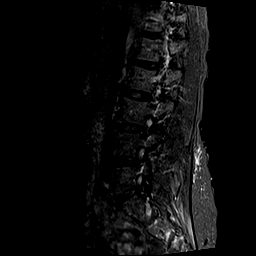
[im 15/15]
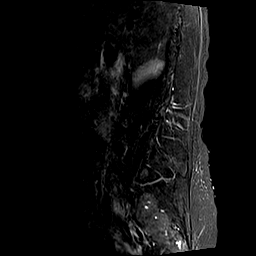

[48 of 48 positions shown; findings below may reference images not displayed]

FINDINGS: Intradiscal degenerative change is present within L1-L2 associated with a broad-based disc bulge that barely effaces the subarachnoid space.  There is very minimal midline canal stenosis at L1-L2.  

L2-L3 is normal.  Image 10 series 4.  

Intradiscal degenerative change is present within L3-L4 associated with very mild biforaminal disc bulging.  There is no significant canal stenosis.  There is mild biforaminal stenosis at L3-L4.  Image 15 series 4. 

At L4-L5 there is mild left foraminal disc herniation that produces mainly left mild foraminal stenosis, see Image 20, series 4.  

L5-S1 is normal.
IMPRESSION: This is an abnormal study due to the presence of minor changes at L4-5,  L3-L4 and L1-L2 described in the text of the report.

## 2021-11-17 IMAGING — MR MRI THORACIC SPINE WITHOUT CONTRAST
7 series · 40 of 48 positions shown · non-contrast
Comparison: none

﻿

Pertinent Hx:    Mid to lower back pain.
TECHNIQUE: T1 and T2 sagittal, T2 axial from T1 to T12 as well as gradient echo imaging performed through the thoracic spine.

[Series 1: cervical loc w/table · sagittal · 3.0mm · 1.25mm/px · 5 of 15 slices shown]
[im 1/15]
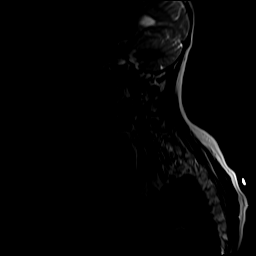
[im 4/15]
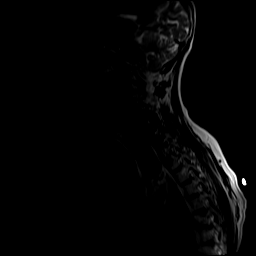
[im 8/15]
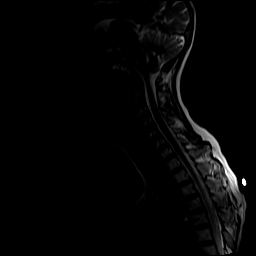
[im 11/15]
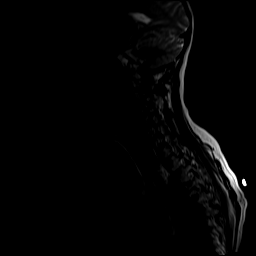
[im 15/15]
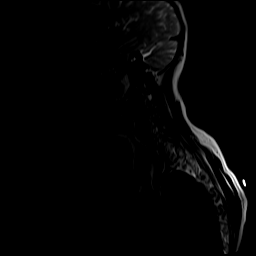

[Series 3: sag/cor/axial loc · axial · 3.0mm · 1.48mm/px · z∈[-224,-22]mm · 5 of 21 slices shown]
[im 1/21]
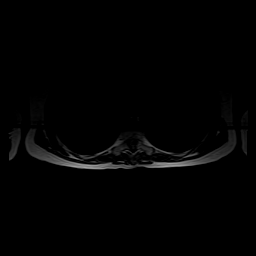
[im 6/21]
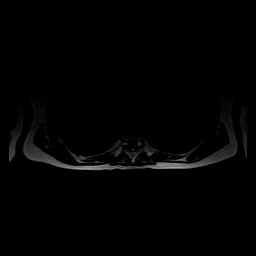
[im 11/21]
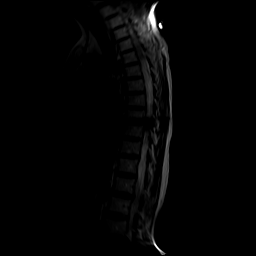
[im 16/21]
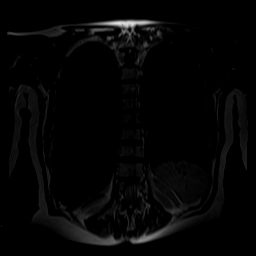
[im 21/21]
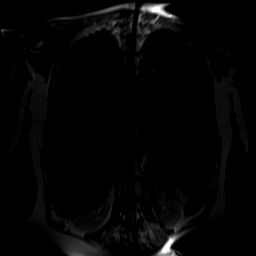

[Series 4: T2 · sagittal · 3.0mm · 1.00mm/px · 4 of 19 slices shown (1 of 2)]
[im 1/19]
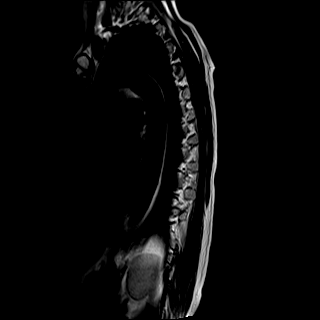
[im 7/19]
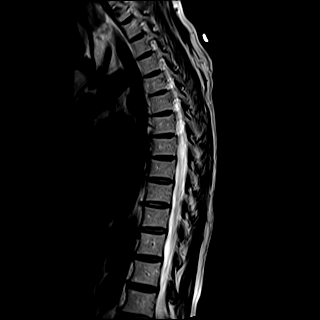
[im 13/19]
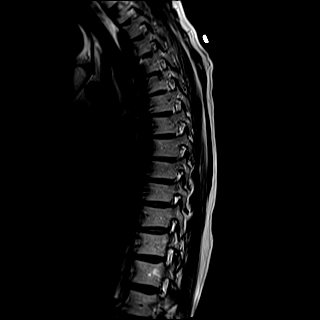
[im 19/19]
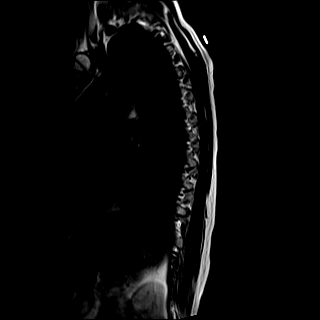

[Series 5: STIR · sagittal · 3.0mm · 1.00mm/px · 4 of 19 slices shown]
[im 1/19]
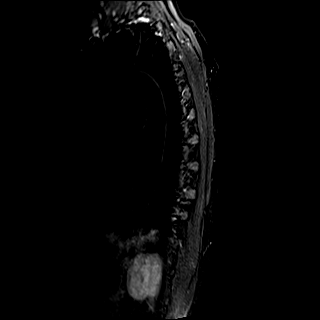
[im 7/19]
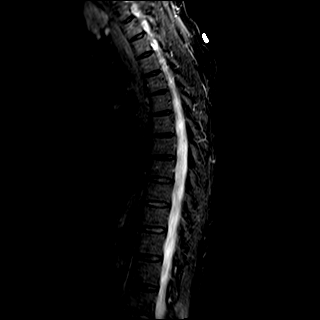
[im 13/19]
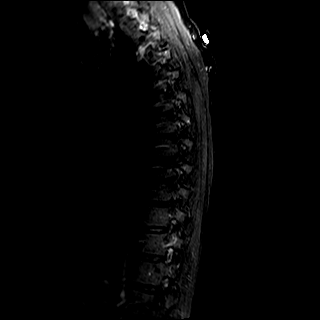
[im 19/19]
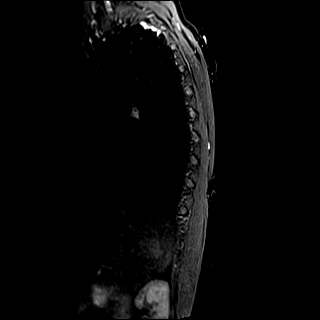

[Series 6: T2 · axial · 5.0mm · 0.70mm/px · z∈[-286,-50]mm · 9 of 56 slices shown (2 of 2)]
[im 1/56]
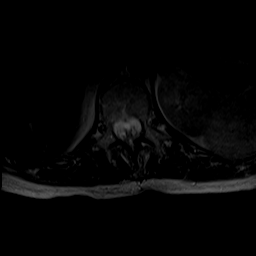
[im 10/56]
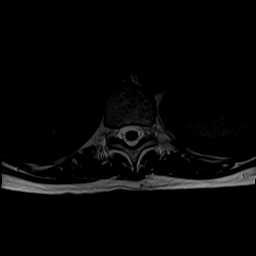
[im 19/56]
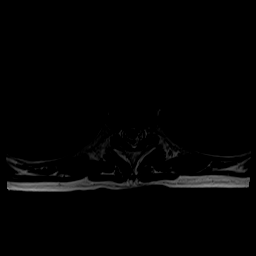
[im 23/56]
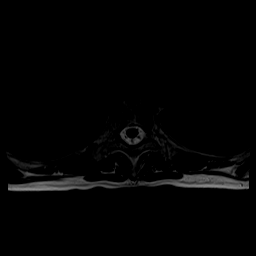
[im 28/56]
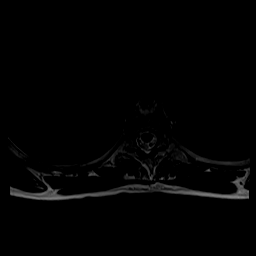
[im 33/56]
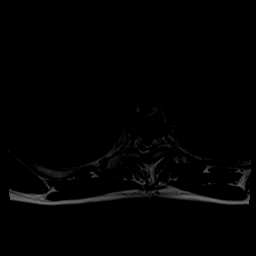
[im 37/56]
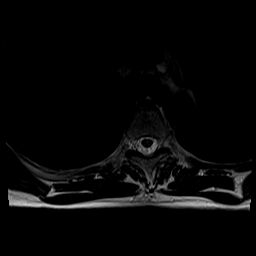
[im 46/56]
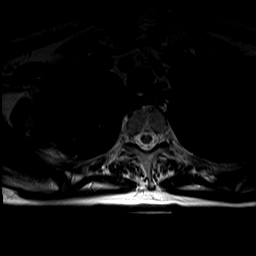
[im 56/56]
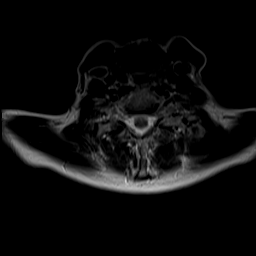

[Series 7: T1 · sagittal · 3.0mm · 1.00mm/px · 4 of 19 slices shown (1 of 2)]
[im 1/19]
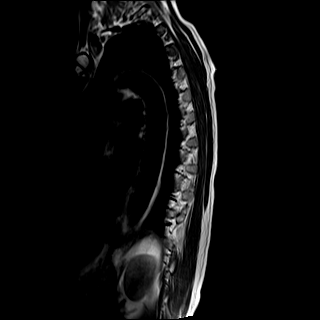
[im 7/19]
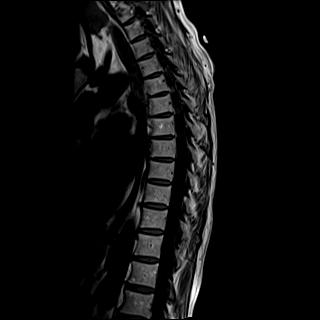
[im 13/19]
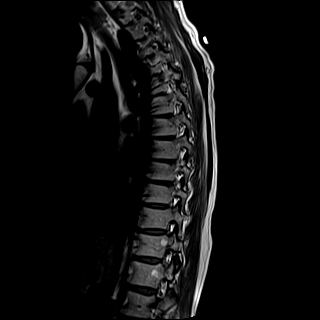
[im 19/19]
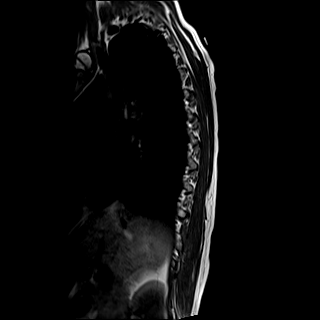

[Series 8: T1 · axial · 5.0mm · 0.35mm/px · z∈[-286,-50]mm · 9 of 56 slices shown (2 of 2)]
[im 1/56]
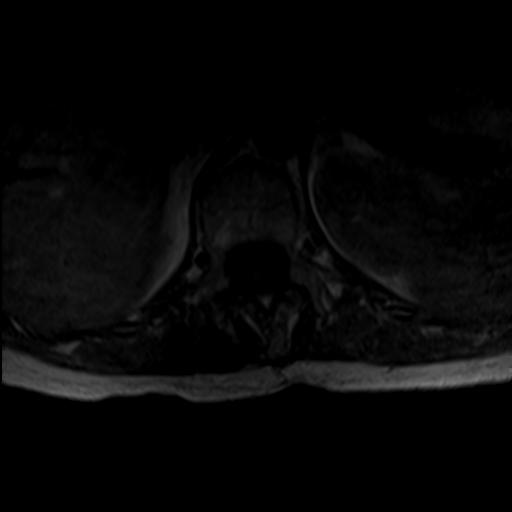
[im 10/56]
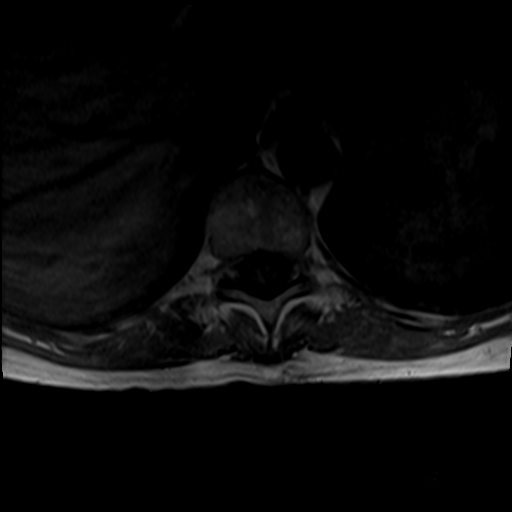
[im 19/56]
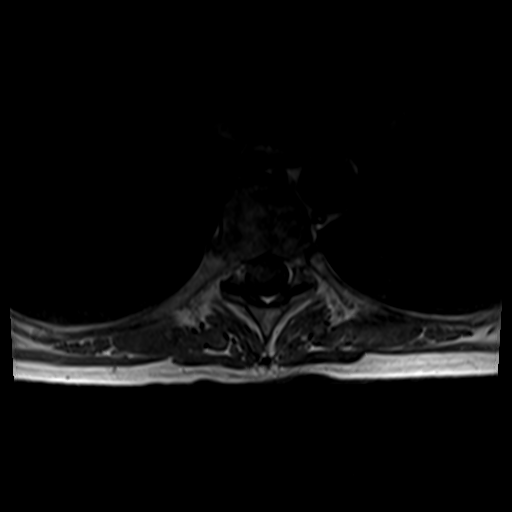
[im 23/56]
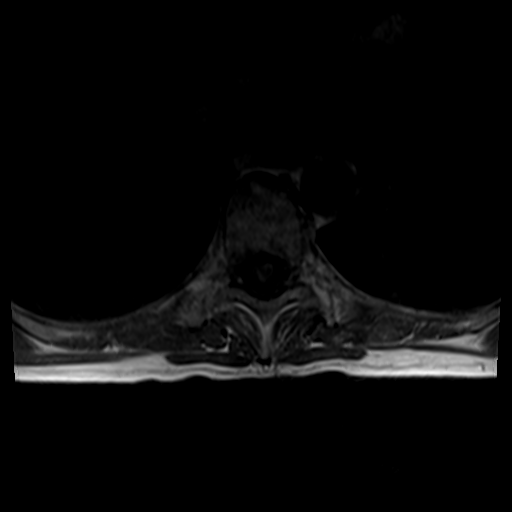
[im 28/56]
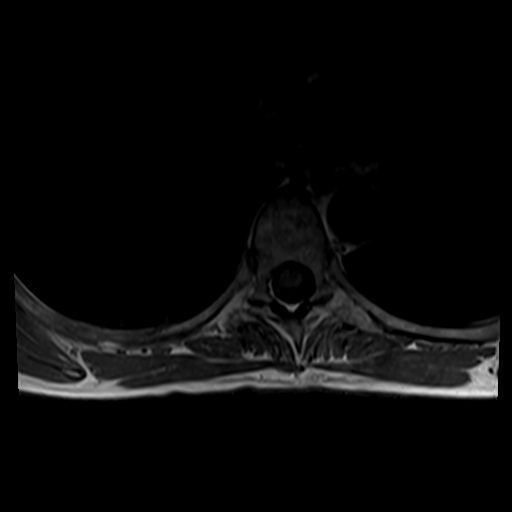
[im 33/56]
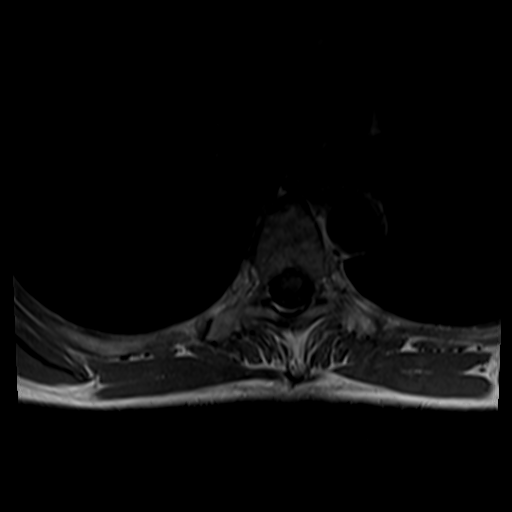
[im 37/56]
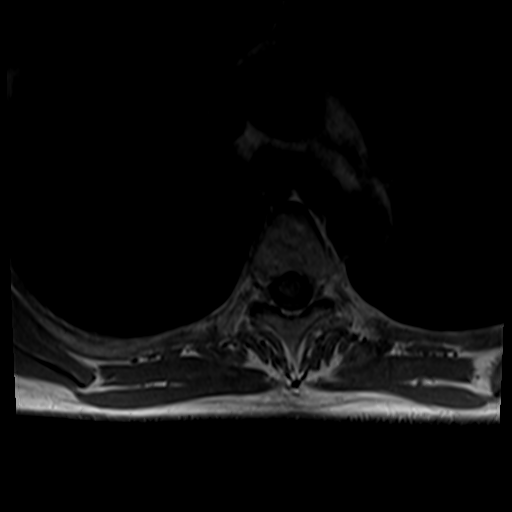
[im 46/56]
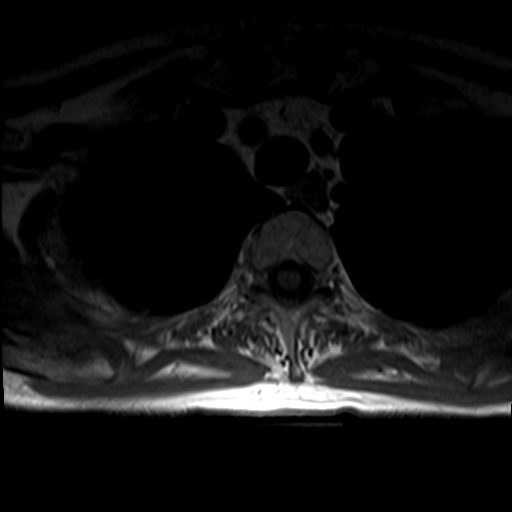
[im 56/56]
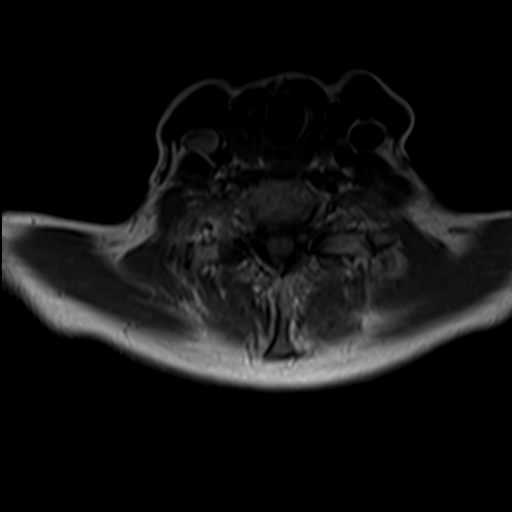

[40 of 48 positions shown; findings below may reference images not displayed]

FINDINGS: There is no canal stenosis. There is normal alignment. There is no disc degeneration. There is no evidence of disc herniation. The spinal cord is normal.
IMPRESSION: Normal MRI of the thoracic spine without contrast.
